# Patient Record
Sex: Male | Born: 1975 | Race: Black or African American | Hispanic: No | Marital: Single | Smoking: Never smoker
Health system: Southern US, Community
[De-identification: ages and names within clinical notes are randomized; demographics above are authoritative.]

---

## 2017-11-06 ENCOUNTER — Encounter (HOSPITAL_BASED_OUTPATIENT_CLINIC_OR_DEPARTMENT_OTHER): Payer: Self-pay | Admitting: Emergency Medicine

## 2017-11-06 ENCOUNTER — Observation Stay (HOSPITAL_BASED_OUTPATIENT_CLINIC_OR_DEPARTMENT_OTHER)
Admission: EM | Admit: 2017-11-06 | Discharge: 2017-11-07 | Disposition: A | Discharge status: Home / Self Care | Payer: BLUE CROSS/BLUE SHIELD | Attending: Surgery | Admitting: Surgery

## 2017-11-06 ENCOUNTER — Encounter (HOSPITAL_COMMUNITY)
Admission: EM | Disposition: A | Discharge status: Home / Self Care | Payer: Self-pay | Source: Home / Self Care | Attending: Emergency Medicine

## 2017-11-06 ENCOUNTER — Emergency Department (HOSPITAL_COMMUNITY): Payer: BLUE CROSS/BLUE SHIELD | Admitting: Certified Registered Nurse Anesthetist

## 2017-11-06 ENCOUNTER — Other Ambulatory Visit: Payer: Self-pay

## 2017-11-06 ENCOUNTER — Emergency Department (HOSPITAL_BASED_OUTPATIENT_CLINIC_OR_DEPARTMENT_OTHER): Payer: BLUE CROSS/BLUE SHIELD

## 2017-11-06 DIAGNOSIS — K3580 Unspecified acute appendicitis: Secondary | ICD-10-CM | POA: Diagnosis present

## 2017-11-06 DIAGNOSIS — K37 Unspecified appendicitis: Secondary | ICD-10-CM | POA: Diagnosis present

## 2017-11-06 DIAGNOSIS — K3589 Other acute appendicitis without perforation or gangrene: Secondary | ICD-10-CM | POA: Diagnosis not present

## 2017-11-06 HISTORY — PX: LAPAROSCOPIC APPENDECTOMY: SHX408

## 2017-11-06 LAB — COMPREHENSIVE METABOLIC PANEL
ALK PHOS: 64 U/L (ref 38–126)
ALT: 34 U/L (ref 0–44)
AST: 29 U/L (ref 15–41)
Albumin: 4.6 g/dL (ref 3.5–5.0)
Anion gap: 11 (ref 5–15)
BILIRUBIN TOTAL: 0.6 mg/dL (ref 0.3–1.2)
BUN: 15 mg/dL (ref 6–20)
CALCIUM: 9.3 mg/dL (ref 8.9–10.3)
CHLORIDE: 94 mmol/L — AB (ref 98–111)
CO2: 27 mmol/L (ref 22–32)
CREATININE: 1.12 mg/dL (ref 0.61–1.24)
GFR calc non Af Amer: >60 mL/min (ref >60)
Glucose, Bld: 131 mg/dL — ABNORMAL HIGH (ref 70–99)
Potassium: 3.6 mmol/L (ref 3.5–5.1)
Sodium: 132 mmol/L — ABNORMAL LOW (ref 135–145)
Total Protein: 7.8 g/dL (ref 6.5–8.1)

## 2017-11-06 LAB — CBC
HCT: 42 % (ref 39.0–52.0)
Hemoglobin: 15.2 g/dL (ref 13.0–17.0)
MCH: 28.8 pg (ref 26.0–34.0)
MCHC: 36.2 g/dL — ABNORMAL HIGH (ref 30.0–36.0)
MCV: 79.5 fL (ref 78.0–100.0)
PLATELETS: 248 10*3/uL (ref 150–400)
RBC: 5.28 MIL/uL (ref 4.22–5.81)
RDW: 13.6 % (ref 11.5–15.5)
WBC: 20 10*3/uL — AB (ref 4.0–10.5)

## 2017-11-06 LAB — URINALYSIS, ROUTINE W REFLEX MICROSCOPIC
Bilirubin Urine: NEGATIVE
GLUCOSE, UA: NEGATIVE mg/dL
KETONES UR: 15 mg/dL — AB
LEUKOCYTES UA: NEGATIVE
Nitrite: NEGATIVE
PROTEIN: 100 mg/dL — AB
Specific Gravity, Urine: 1.025 (ref 1.005–1.030)
pH: 6.5 (ref 5.0–8.0)

## 2017-11-06 LAB — URINALYSIS, MICROSCOPIC (REFLEX)

## 2017-11-06 LAB — LIPASE, BLOOD: Lipase: 36 U/L (ref 11–51)

## 2017-11-06 SURGERY — APPENDECTOMY, LAPAROSCOPIC
Anesthesia: General

## 2017-11-06 MED ORDER — PHENYLEPHRINE 40 MCG/ML (10ML) SYRINGE FOR IV PUSH (FOR BLOOD PRESSURE SUPPORT)
PREFILLED_SYRINGE | INTRAVENOUS | Status: AC
  Filled 2017-11-06: qty 10

## 2017-11-06 MED ORDER — OXYCODONE HCL 5 MG PO TABS: 5.0000 mg | ORAL_TABLET | Freq: Once | ORAL | Status: DC | PRN

## 2017-11-06 MED ORDER — KETOROLAC TROMETHAMINE 30 MG/ML IJ SOLN
INTRAMUSCULAR | Status: AC
  Filled 2017-11-06: qty 1

## 2017-11-06 MED ORDER — OXYCODONE HCL 5 MG PO TABS
5.0000 mg | ORAL_TABLET | ORAL | Status: DC | PRN
  Administered 2017-11-07: 10 mg via ORAL
  Filled 2017-11-06: qty 2

## 2017-11-06 MED ORDER — ROCURONIUM BROMIDE 10 MG/ML (PF) SYRINGE
PREFILLED_SYRINGE | INTRAVENOUS | Status: DC | PRN
  Administered 2017-11-06: 40 mg via INTRAVENOUS

## 2017-11-06 MED ORDER — OXYCODONE HCL 5 MG/5ML PO SOLN
5.0000 mg | Freq: Once | ORAL | Status: DC | PRN
  Filled 2017-11-06: qty 5

## 2017-11-06 MED ORDER — SODIUM CHLORIDE 0.9 % IV SOLN: Freq: Once | INTRAVENOUS | Status: DC

## 2017-11-06 MED ORDER — CEFAZOLIN SODIUM-DEXTROSE 2-3 GM-%(50ML) IV SOLR
INTRAVENOUS | Status: DC | PRN
  Administered 2017-11-06: 2 g via INTRAVENOUS

## 2017-11-06 MED ORDER — FENTANYL CITRATE (PF) 100 MCG/2ML IJ SOLN
INTRAMUSCULAR | Status: DC | PRN
  Administered 2017-11-06: 50 ug via INTRAVENOUS
  Administered 2017-11-06: 100 ug via INTRAVENOUS
  Administered 2017-11-06: 50 ug via INTRAVENOUS

## 2017-11-06 MED ORDER — SUCCINYLCHOLINE CHLORIDE 200 MG/10ML IV SOSY
PREFILLED_SYRINGE | INTRAVENOUS | Status: AC
  Filled 2017-11-06: qty 10

## 2017-11-06 MED ORDER — ONDANSETRON HCL 4 MG/2ML IJ SOLN
INTRAMUSCULAR | Status: AC
  Filled 2017-11-06: qty 2

## 2017-11-06 MED ORDER — ENOXAPARIN SODIUM 40 MG/0.4ML ~~LOC~~ SOLN: 40.0000 mg | SUBCUTANEOUS | Status: DC

## 2017-11-06 MED ORDER — PIPERACILLIN-TAZOBACTAM 3.375 G IVPB 30 MIN
3.3750 g | Freq: Once | INTRAVENOUS | Status: AC
  Administered 2017-11-06: 3.375 g via INTRAVENOUS
  Filled 2017-11-06 (×2): qty 50

## 2017-11-06 MED ORDER — ONDANSETRON HCL 4 MG/2ML IJ SOLN
4.0000 mg | Freq: Once | INTRAMUSCULAR | Status: AC
  Administered 2017-11-06: 4 mg via INTRAVENOUS
  Filled 2017-11-06: qty 2

## 2017-11-06 MED ORDER — LACTATED RINGERS IV SOLN
INTRAVENOUS | Status: AC | PRN
  Administered 2017-11-06: 1000 mL via INTRAVENOUS

## 2017-11-06 MED ORDER — FENTANYL CITRATE (PF) 100 MCG/2ML IJ SOLN: 25.0000 ug | INTRAMUSCULAR | Status: DC | PRN

## 2017-11-06 MED ORDER — PIPERACILLIN-TAZOBACTAM 3.375 G IVPB
3.3750 g | Freq: Three times a day (TID) | INTRAVENOUS | Status: AC
  Administered 2017-11-06: 3.375 g via INTRAVENOUS
  Filled 2017-11-06: qty 50

## 2017-11-06 MED ORDER — LACTATED RINGERS IV SOLN
INTRAVENOUS | Status: DC | PRN
Start: 2017-11-06 — End: 2017-11-06
  Administered 2017-11-06 (×2): via INTRAVENOUS

## 2017-11-06 MED ORDER — BUPIVACAINE HCL (PF) 0.5 % IJ SOLN
INTRAMUSCULAR | Status: AC
  Filled 2017-11-06: qty 30

## 2017-11-06 MED ORDER — MIDAZOLAM HCL 2 MG/2ML IJ SOLN
INTRAMUSCULAR | Status: AC
  Filled 2017-11-06: qty 2

## 2017-11-06 MED ORDER — SUGAMMADEX SODIUM 200 MG/2ML IV SOLN
INTRAVENOUS | Status: DC | PRN
  Administered 2017-11-06: 200 mg via INTRAVENOUS

## 2017-11-06 MED ORDER — IOPAMIDOL (ISOVUE-300) INJECTION 61%
100.0000 mL | Freq: Once | INTRAVENOUS | Status: AC | PRN
  Administered 2017-11-06: 100 mL via INTRAVENOUS

## 2017-11-06 MED ORDER — ONDANSETRON HCL 4 MG/2ML IJ SOLN: 4.0000 mg | Freq: Once | INTRAMUSCULAR | Status: DC | PRN

## 2017-11-06 MED ORDER — SUCCINYLCHOLINE CHLORIDE 20 MG/ML IJ SOLN
INTRAMUSCULAR | Status: DC | PRN
  Administered 2017-11-06: 120 mg via INTRAVENOUS

## 2017-11-06 MED ORDER — KETOROLAC TROMETHAMINE 30 MG/ML IJ SOLN
INTRAMUSCULAR | Status: DC | PRN
  Administered 2017-11-06: 30 mg via INTRAVENOUS

## 2017-11-06 MED ORDER — IOPAMIDOL (ISOVUE-300) INJECTION 61%
30.0000 mL | Freq: Once | INTRAVENOUS | Status: AC | PRN
  Administered 2017-11-06: 15 mL via ORAL

## 2017-11-06 MED ORDER — HYDROMORPHONE HCL 1 MG/ML IJ SOLN
1.0000 mg | Freq: Once | INTRAMUSCULAR | Status: AC
  Administered 2017-11-06: 1 mg via INTRAVENOUS
  Filled 2017-11-06: qty 1

## 2017-11-06 MED ORDER — ONDANSETRON HCL 4 MG/2ML IJ SOLN
INTRAMUSCULAR | Status: DC | PRN
  Administered 2017-11-06: 4 mg via INTRAVENOUS

## 2017-11-06 MED ORDER — CEFAZOLIN SODIUM-DEXTROSE 2-4 GM/100ML-% IV SOLN
INTRAVENOUS | Status: AC
  Filled 2017-11-06: qty 100

## 2017-11-06 MED ORDER — ACETAMINOPHEN 500 MG PO TABS
1000.0000 mg | ORAL_TABLET | Freq: Four times a day (QID) | ORAL | Status: DC
  Administered 2017-11-06 – 2017-11-07 (×3): 1000 mg via ORAL
  Filled 2017-11-06 (×3): qty 2

## 2017-11-06 MED ORDER — PHENYLEPHRINE 40 MCG/ML (10ML) SYRINGE FOR IV PUSH (FOR BLOOD PRESSURE SUPPORT)
PREFILLED_SYRINGE | INTRAVENOUS | Status: DC | PRN
  Administered 2017-11-06: 80 ug via INTRAVENOUS
  Administered 2017-11-06: 120 ug via INTRAVENOUS

## 2017-11-06 MED ORDER — SODIUM CHLORIDE 0.9 % IV BOLUS
1000.0000 mL | Freq: Once | INTRAVENOUS | Status: AC
  Administered 2017-11-06: 1000 mL via INTRAVENOUS

## 2017-11-06 MED ORDER — MIDAZOLAM HCL 5 MG/5ML IJ SOLN
INTRAMUSCULAR | Status: DC | PRN
  Administered 2017-11-06: 2 mg via INTRAVENOUS

## 2017-11-06 MED ORDER — DIPHENHYDRAMINE HCL 50 MG/ML IJ SOLN: 25.0000 mg | Freq: Four times a day (QID) | INTRAMUSCULAR | Status: DC | PRN

## 2017-11-06 MED ORDER — ACETAMINOPHEN 160 MG/5ML PO SOLN: 325.0000 mg | ORAL | Status: DC | PRN

## 2017-11-06 MED ORDER — HYDROMORPHONE HCL 1 MG/ML IJ SOLN: 1.0000 mg | INTRAMUSCULAR | Status: DC | PRN

## 2017-11-06 MED ORDER — DEXAMETHASONE SODIUM PHOSPHATE 10 MG/ML IJ SOLN
INTRAMUSCULAR | Status: DC | PRN
  Administered 2017-11-06: 5 mg via INTRAVENOUS

## 2017-11-06 MED ORDER — ACETAMINOPHEN 325 MG PO TABS: 325.0000 mg | ORAL_TABLET | ORAL | Status: DC | PRN

## 2017-11-06 MED ORDER — ONDANSETRON 4 MG PO TBDP: 4.0000 mg | ORAL_TABLET | Freq: Four times a day (QID) | ORAL | Status: DC | PRN

## 2017-11-06 MED ORDER — FENTANYL CITRATE (PF) 250 MCG/5ML IJ SOLN
INTRAMUSCULAR | Status: AC
  Filled 2017-11-06: qty 5

## 2017-11-06 MED ORDER — POTASSIUM CHLORIDE IN NACL 20-0.9 MEQ/L-% IV SOLN
INTRAVENOUS | Status: DC
  Administered 2017-11-06: 23:00:00 via INTRAVENOUS
  Filled 2017-11-06 (×2): qty 1000

## 2017-11-06 MED ORDER — BUPIVACAINE HCL (PF) 0.5 % IJ SOLN
INTRAMUSCULAR | Status: DC | PRN
  Administered 2017-11-06: 20 mL

## 2017-11-06 MED ORDER — LIDOCAINE 2% (20 MG/ML) 5 ML SYRINGE
INTRAMUSCULAR | Status: DC | PRN
  Administered 2017-11-06: 80 mg via INTRAVENOUS

## 2017-11-06 MED ORDER — PROPOFOL 10 MG/ML IV BOLUS
INTRAVENOUS | Status: DC | PRN
  Administered 2017-11-06: 200 mg via INTRAVENOUS

## 2017-11-06 MED ORDER — MORPHINE SULFATE (PF) 2 MG/ML IV SOLN
1.0000 mg | INTRAVENOUS | Status: DC | PRN
  Administered 2017-11-07 (×2): 4 mg via INTRAVENOUS
  Filled 2017-11-06 (×2): qty 2

## 2017-11-06 MED ORDER — SUGAMMADEX SODIUM 200 MG/2ML IV SOLN
INTRAVENOUS | Status: AC
  Filled 2017-11-06: qty 4

## 2017-11-06 MED ORDER — PROPOFOL 10 MG/ML IV BOLUS
INTRAVENOUS | Status: AC
  Filled 2017-11-06: qty 20

## 2017-11-06 MED ORDER — EPHEDRINE 5 MG/ML INJ
INTRAVENOUS | Status: AC
  Filled 2017-11-06: qty 10

## 2017-11-06 MED ORDER — ONDANSETRON HCL 4 MG/2ML IJ SOLN: 4.0000 mg | Freq: Four times a day (QID) | INTRAMUSCULAR | Status: DC | PRN

## 2017-11-06 MED ORDER — DEXAMETHASONE SODIUM PHOSPHATE 10 MG/ML IJ SOLN
INTRAMUSCULAR | Status: AC
  Filled 2017-11-06: qty 1

## 2017-11-06 MED ORDER — MEPERIDINE HCL 50 MG/ML IJ SOLN: 6.2500 mg | INTRAMUSCULAR | Status: DC | PRN

## 2017-11-06 MED ORDER — DIPHENHYDRAMINE HCL 25 MG PO CAPS: 25.0000 mg | ORAL_CAPSULE | Freq: Four times a day (QID) | ORAL | Status: DC | PRN

## 2017-11-06 SURGICAL SUPPLY — 29 items
APPLIER CLIP 5 13 M/L LIGAMAX5 (MISCELLANEOUS)
CHLORAPREP W/TINT 26ML (MISCELLANEOUS) ×3 IMPLANT
CLIP APPLIE 5 13 M/L LIGAMAX5 (MISCELLANEOUS) IMPLANT
COVER SURGICAL LIGHT HANDLE (MISCELLANEOUS) ×3 IMPLANT
CUTTER FLEX LINEAR 45M (STAPLE) IMPLANT
DECANTER SPIKE VIAL GLASS SM (MISCELLANEOUS) ×3 IMPLANT
DERMABOND ADVANCED (GAUZE/BANDAGES/DRESSINGS) ×2
DERMABOND ADVANCED .7 DNX12 (GAUZE/BANDAGES/DRESSINGS) ×1 IMPLANT
DRAPE LAPAROSCOPIC ABDOMINAL (DRAPES) ×3 IMPLANT
ELECT COAG MONOPOLAR (ELECTROSURGICAL) ×3
ELECT REM PT RETURN 15FT ADLT (MISCELLANEOUS) ×3 IMPLANT
ELECTRODE COAG MONOPOLAR (ELECTROSURGICAL) ×1 IMPLANT
GLOVE SURG SIGNA 7.5 PF LTX (GLOVE) ×6 IMPLANT
GOWN STRL REUS W/TWL XL LVL3 (GOWN DISPOSABLE) ×6 IMPLANT
KIT BASIN OR (CUSTOM PROCEDURE TRAY) ×3 IMPLANT
POUCH SPECIMEN RETRIEVAL 10MM (ENDOMECHANICALS) ×3 IMPLANT
RELOAD 45 VASCULAR/THIN (ENDOMECHANICALS) ×3 IMPLANT
RELOAD STAPLE TA45 3.5 REG BLU (ENDOMECHANICALS) ×3 IMPLANT
SET IRRIG TUBING LAPAROSCOPIC (IRRIGATION / IRRIGATOR) ×3 IMPLANT
SHEARS HARMONIC ACE PLUS 36CM (ENDOMECHANICALS) ×3 IMPLANT
SLEEVE XCEL OPT CAN 5 100 (ENDOMECHANICALS) ×3 IMPLANT
SUT MNCRL AB 4-0 PS2 18 (SUTURE) ×3 IMPLANT
TOWEL OR 17X26 10 PK STRL BLUE (TOWEL DISPOSABLE) ×3 IMPLANT
TOWEL OR NON WOVEN STRL DISP B (DISPOSABLE) ×3 IMPLANT
TRAY FOLEY MTR SLVR 16FR STAT (SET/KITS/TRAYS/PACK) ×3 IMPLANT
TRAY LAPAROSCOPIC (CUSTOM PROCEDURE TRAY) ×3 IMPLANT
TROCAR BLADELESS OPT 5 100 (ENDOMECHANICALS) ×3 IMPLANT
TROCAR XCEL BLUNT TIP 100MML (ENDOMECHANICALS) ×3 IMPLANT
TUBING INSUF HEATED (TUBING) ×3 IMPLANT

## 2017-11-06 NOTE — Progress Notes (Signed)
Patient admitted from [facility] at 2230, s/p lap app.  AOX3 minimal surgical discomfort. B/p 157/107 Dr Magnus Ivanblackman aware, no new orders noted

## 2017-11-06 NOTE — Anesthesia Procedure Notes (Signed)
Procedure Name: Intubation Performed by: Zinia Innocent J, CRNA Pre-anesthesia Checklist: Patient identified, Emergency Drugs available, Suction available, Patient being monitored and Timeout performed Patient Re-evaluated:Patient Re-evaluated prior to induction Oxygen Delivery Method: Circle system utilized Preoxygenation: Pre-oxygenation with 100% oxygen Induction Type: IV induction and Rapid sequence Laryngoscope Size: Mac and 4 Grade View: Grade I Tube type: Oral Tube size: 7.5 mm Number of attempts: 1 Airway Equipment and Method: Stylet Placement Confirmation: ETT inserted through vocal cords under direct vision,  positive ETCO2,  CO2 detector and breath sounds checked- equal and bilateral Secured at: 23 cm Tube secured with: Tape Dental Injury: Teeth and Oropharynx as per pre-operative assessment        

## 2017-11-06 NOTE — ED Provider Notes (Signed)
[facility] EMERGENCY DEPARTMENT Provider Note   CSN: [id number] Arrival date & time: 11/06/17  1301     History   Chief Complaint Chief Complaint  Patient presents with  . Abdominal Pain    HPI Jeremy ShieldsRicky Savini is a 42 y.o. male.  HPI  Jeremy ShieldsRicky Lewis is a 42 year old male with no significant past medical history who presents emergency department for evaluation of right lower quadrant pain.  Patient reports that his pain began gradually today at 12 AM.  He was working and had to go home due to the pain.  Pain feels "like someone is punching me" and is constant.  It is currently an 8/10 in severity at this time.  His pain occasionally radiates to the right flank.  He tried taking some Gas-X earlier today which helped somewhat.  He has also had nausea and vomiting, reports 4 episodes of nonbloody emesis today.  He denies fevers, chills, penile discharge, dysuria, urinary frequency, hematuria, trouble voiding, testicular pain/swelling, diarrhea, chest pain, shortness of breath, lightheadedness or syncope.  He denies prior abdominal surgeries.  Last bowel movement was today and normal for him.  History reviewed. No pertinent past medical history.  There are no active problems to display for this patient.   History reviewed. No pertinent surgical history.      Home Medications    Prior to Admission medications   Not on File    Family History No family history on file.  Social History Social History   Tobacco Use  . Smoking status: Never Smoker  . Smokeless tobacco: Never Used  Substance Use Topics  . Alcohol use: Not Currently  . Drug use: Never     Allergies   Patient has no allergy information on record.   Review of Systems Review of Systems  Constitutional: Negative for chills and fever.  Eyes: Negative for visual disturbance.  Respiratory: Negative for shortness of breath.   Cardiovascular: Negative for chest pain.  Gastrointestinal: Positive for  abdominal pain (rlq), nausea and vomiting. Negative for blood in stool and diarrhea.  Genitourinary: Negative for difficulty urinating, dysuria, frequency, hematuria, scrotal swelling and testicular pain.  Musculoskeletal: Negative for back pain.  Skin: Negative for rash.  Neurological: Negative for syncope and light-headedness.  Psychiatric/Behavioral: Negative for agitation.     Physical Exam Updated Vital Signs BP (!) 177/110 (BP Location: Left Arm)   Pulse (!) 107   Temp 98.7 F (37.1 C) (Oral)   Resp 18   Ht 5\' 10"  (1.778 m)   Wt 88.5 kg (195 lb)   SpO2 98%   BMI 27.98 kg/m   Physical Exam  Constitutional: He is oriented to person, place, and time. He appears well-developed and well-nourished. No distress.  Non-toxic appearing.   HENT:  Head: Normocephalic and atraumatic.  Mouth/Throat: Oropharynx is clear and moist. No oropharyngeal exudate.  Mucous membranes moist.  Eyes: Pupils are equal, round, and reactive to light. Conjunctivae are normal. Right eye exhibits no discharge. Left eye exhibits no discharge.  Neck: Normal range of motion. Neck supple.  Cardiovascular: Normal rate, regular rhythm and intact distal pulses.  No murmur heard. Pulmonary/Chest: Effort normal and breath sounds normal. No stridor. No respiratory distress. He has no wheezes. He has no rales.  Abdominal: Soft.  Abdomen soft and non-distended. Tender to palpation right of the umbilicus as well as right flank. No guarding, rigidity or rebound tenderness. Negative McBurney's point. Negative Murphy's sign. No CVA tenderness.   Musculoskeletal: Normal range of  motion.  Neurological: He is alert and oriented to person, place, and time. Coordination normal.  Skin: Skin is warm and dry. Capillary refill takes less than 2 seconds. He is not diaphoretic.  Psychiatric: He has a normal mood and affect. His behavior is normal.  Nursing note and vitals reviewed.    ED Treatments / Results  Labs (all labs  ordered are listed, but only abnormal results are displayed) Labs Reviewed  COMPREHENSIVE METABOLIC PANEL - Abnormal; Notable for the following components:      Result Value   Sodium 132 (*)    Chloride 94 (*)    Glucose, Bld 131 (*)    All other components within normal limits  CBC - Abnormal; Notable for the following components:   WBC 20.0 (*)    MCHC 36.2 (*)    All other components within normal limits  URINALYSIS, ROUTINE W REFLEX MICROSCOPIC - Abnormal; Notable for the following components:   Hgb urine dipstick LARGE (*)    Ketones, ur 15 (*)    Protein, ur 100 (*)    All other components within normal limits  URINALYSIS, MICROSCOPIC (REFLEX) - Abnormal; Notable for the following components:   Bacteria, UA FEW (*)    All other components within normal limits  LIPASE, BLOOD  BASIC METABOLIC PANEL  CBC  SURGICAL PATHOLOGY    EKG None  Radiology Ct Abdomen Pelvis W Contrast  Result Date: 11/06/2017 CLINICAL DATA:  RIGHT lower quadrant pain since midnight. EXAM: CT ABDOMEN AND PELVIS WITH CONTRAST TECHNIQUE: Multidetector CT imaging of the abdomen and pelvis was performed using the standard protocol following bolus administration of intravenous contrast. CONTRAST:  15mL ISOVUE-300 IOPAMIDOL (ISOVUE-300) INJECTION 61%, ISOVUE-300 IOPAMIDOL (ISOVUE-300) INJECTION 61% COMPARISON:  None. FINDINGS: Lower chest: No acute abnormality. Hepatobiliary: No focal liver abnormality is seen. No gallstones, gallbladder wall thickening, or biliary dilatation. Pancreas: Unremarkable. No pancreatic ductal dilatation or surrounding inflammatory changes. Spleen: Normal in size without focal abnormality. Adrenals/Urinary Tract: Adrenal glands are unremarkable. Kidneys are normal, without renal calculi, focal lesion, or hydronephrosis. Bladder is unremarkable. Simple cysts in the RIGHT greater than LEFT kidney, largest of which is RIGHT parapelvic, greater than 3 cm. Stomach/Bowel: Stomach, and  small bowel are normal. There is acute appendicitis, dilated appendix with appendicolith up to 14 mm in diameter with surrounding inflammation, up to 14 mm in diameter, with surrounding inflammatory change, query localized perforation. The cecum is intrahepatic, and the inflammatory change extends along the RIGHT pericolic gutter. Vascular/Lymphatic: No significant vascular findings are present. No enlarged abdominal or pelvic lymph nodes. Reproductive: Prostate is unremarkable. Other: Small fat containing umbilical hernia. Musculoskeletal: Lumbar spondylosis. IMPRESSION: Acute appendicitis, with possible localized perforation. These results were called by telephone at the time of interpretation on 11/06/2017 at 4:04 pm to [facility] , who verbally acknowledged these results. Electronically Signed   By: Elsie Stain M.D.   On: 11/06/2017 16:06    Procedures Procedures (including critical care time)  Medications Ordered in ED Medications  HYDROmorphone (DILAUDID) injection 1 mg (has no administration in time range)  ondansetron (ZOFRAN) injection 4 mg (has no administration in time range)  sodium chloride 0.9 % bolus 1,000 mL (has no administration in time range)     Initial Impression / Assessment and Plan / ED Course  I have reviewed the triage vital signs and the nursing notes.  Pertinent labs & imaging results that were available during my care of the patient were reviewed by me and considered  in my medical decision making (see chart for details).     CT abdomen/pelvis with acute appendicitis with possible local perforation.  Patient started on IV Zosyn.  Discussed this patient with Dr. Magnus Ivan who plans to operate later today.  Patient will be transferred to [facility].  Discussed this patient with Dr. Erma Heritage who will accept patient for ED to ED transfer.  Patient informed about plan and he agrees.  Final Clinical Impressions(s) / ED Diagnoses   Final diagnoses:  Acute  appendicitis, unspecified acute appendicitis type    ED Discharge Orders    None       Lawrence Marseilles 11/07/17 0040    Tilden Fossa, MD 11/07/17 [phone]

## 2017-11-06 NOTE — Anesthesia Preprocedure Evaluation (Signed)
Anesthesia Evaluation  Patient identified by MRN, date of birth, ID band Patient awake    Reviewed: Allergy & Precautions, H&P , NPO status , Patient's Chart, lab work & pertinent test results, reviewed documented beta blocker date and time   Airway Mallampati: II  TM Distance: >3 FB Neck ROM: full    Dental no notable dental hx. (+) Teeth Intact   Pulmonary    Pulmonary exam normal breath sounds clear to auscultation       Cardiovascular Exercise Tolerance: Good Normal cardiovascular exam Rhythm:regular Rate:Normal     Neuro/Psych negative psych ROS   GI/Hepatic   Endo/Other    Renal/GU      Musculoskeletal   Abdominal   Peds  Hematology   Anesthesia Other Findings   Reproductive/Obstetrics                             Anesthesia Physical Anesthesia Plan  ASA: II and emergent  Anesthesia Plan: General   Post-op Pain Management:    Induction: Cricoid pressure planned  PONV Risk Score and Plan: 2 and Ondansetron, Dexamethasone and Treatment may vary due to age or medical condition  Airway Management Planned: Oral ETT  Additional Equipment:   Intra-op Plan:   Post-operative Plan:   Informed Consent: I have reviewed the patients History and Physical, chart, labs and discussed the procedure including the risks, benefits and alternatives for the proposed anesthesia with the patient or authorized representative who has indicated his/her understanding and acceptance.   Dental Advisory Given  Plan Discussed with: CRNA, Surgeon and Anesthesiologist  Anesthesia Plan Comments:         Anesthesia Quick Evaluation

## 2017-11-06 NOTE — ED Notes (Signed)
EDP aware of pt's BP.  No new orders given at present

## 2017-11-06 NOTE — Op Note (Signed)
Appendectomy, Lap, Procedure Note  Indications: The patient presented with a history of right-sided abdominal pain. A CT revealed findings consistent with acute appendicitis.  Pre-operative Diagnosis: acute appendicitis  Post-operative Diagnosis: Same  Surgeon: Abigail MiyamotoBLACKMAN,Khamron Gellert A   Assistants: 0  Anesthesia: General endotracheal anesthesia  ASA Class: 2  Procedure Details  The patient was seen again in the Holding Room. The risks, benefits, complications, treatment options, and expected outcomes were discussed with the patient and/or family. The possibilities of reaction to medication, perforation of viscus, bleeding, recurrent infection, finding a normal appendix, the need for additional procedures, failure to diagnose a condition, and creating a complication requiring transfusion or operation were discussed. There was concurrence with the proposed plan and informed consent was obtained. The site of surgery was properly noted. The patient was taken to Operating Room, identified as Jeremy Lewis and the procedure verified as Appendectomy. A Time Out was held and the above information confirmed.  The patient was placed in the supine position and general anesthesia was induced, along with placement of orogastric tube, Venodyne boots, and a Foley catheter. The abdomen was prepped and draped in a sterile fashion. A one centimeter infraumbilical incision was made.  The  midline fascia was incised with a #15 blade.  A Kelly clamp was used to confirm entrance into the peritoneal cavity.  A pursestring suture was passed around the incision with a 0 Vicryl.  The Hasson was introduced into the abdomen and the tails of the suture were used to hold the Hasson in place.   The pneumoperitoneum was then established to steady pressure of 15 mmHg.  Additional 5 mm cannulas then placed in the left lower quadrant of the abdomen and right upper quadrant under direct visualization. A careful evaluation of the entire  abdomen was carried out. The patient was placed in Trendelenburg and left lateral decubitus position. The small intestines were retracted in the cephalad and left lateral direction away from the pelvis and right lower quadrant. The patient was found to have an enlarged and inflamed appendix that was extending into the pelvis. There was no evidence of perforation.  The appendix was carefully dissected. The appendix was was skeletonized with the harmonic scalpel.   The appendix was divided at its base using an endo-GIA stapler. Minimal appendiceal stump was left in place. There was no evidence of bleeding, leakage, or complication after division of the appendix. Irrigation was also performed and irrigate suctioned from the abdomen as well.  The umbilical port site was closed with the purse string suture. There was no residual palpable fascial defect.  The trocar site skin wounds were closed with 4-0 Monocryl.  Instrument, sponge, and needle counts were correct at the conclusion of the case.   Findings: The appendix was found to be inflamed. There were not signs of necrosis.  There was not perforation. There was not abscess formation.  Estimated Blood Loss:  Minimal         Drains:none         Complications:  None; patient tolerated the procedure well.         Disposition: PACU - hemodynamically stable.         Condition: stable

## 2017-11-06 NOTE — H&P (Signed)
Jeremy Lewis is an 42 y.o. male.   Chief Complaint: RLQ abdominal pain HPI: This is a 42 year old gentleman who presents with right lower quadrant abdominal pain.  He started developing burning abdominal pain around midnight.  He was currently working and had to leave work.  He then developed nausea and vomiting.  He ended up going to [place].  He had a CT scan showing acute appendicitis so he was then transferred to [facility].  He is otherwise healthy without complaints.  Bowel movements have been normal.  The pain is currently described as sharp and moderate to severe in the right lower quadrant.  History reviewed. No pertinent past medical history.  History reviewed. No pertinent surgical history.  No family history on file. Social History:  reports that he has never smoked. He has never used smokeless tobacco. He reports that he drank alcohol. He reports that he does not use drugs.  Allergies: No Known Allergies   (Not in a hospital admission)  Results for orders placed or performed during the hospital encounter of 11/06/17 (from the past 48 hour(s))  Urinalysis, Routine w reflex microscopic     Status: Abnormal   Collection Time: 11/06/17  1:13 PM  Result Value Ref Range   Color, Urine YELLOW YELLOW   APPearance CLEAR CLEAR   Specific Gravity, Urine 1.025 1.005 - 1.030   pH 6.5 5.0 - 8.0   Glucose, UA NEGATIVE NEGATIVE mg/dL   Hgb urine dipstick LARGE (A) NEGATIVE   Bilirubin Urine NEGATIVE NEGATIVE   Ketones, ur 15 (A) NEGATIVE mg/dL   Protein, ur 100 (A) NEGATIVE mg/dL   Nitrite NEGATIVE NEGATIVE   Leukocytes, UA NEGATIVE NEGATIVE    Comment: Performed at [facility], [address] [place], [place] [address]  Urinalysis, Microscopic (reflex)     Status: Abnormal   Collection Time: 11/06/17  1:13 PM  Result Value Ref Range   RBC / HPF 21-50 0 - 5 RBC/hpf   WBC, UA 0-5 0 - 5 WBC/hpf   Bacteria, UA FEW (A) NONE SEEN   Squamous  Epithelial / LPF 0-5 0 - 5   Mucus PRESENT     Comment: Performed at [facility], [address] [place], [place] [address]  Lipase, blood     Status: None   Collection Time: 11/06/17  2:00 PM  Result Value Ref Range   Lipase 36 11 - 51 U/L    Comment: Performed at [facility], [address] [place], [place] [address]  Comprehensive metabolic panel     Status: Abnormal   Collection Time: 11/06/17  2:00 PM  Result Value Ref Range   Sodium 132 (L) 135 - 145 mmol/L   Potassium 3.6 3.5 - 5.1 mmol/L   Chloride 94 (L) 98 - 111 mmol/L    Comment: Please note change in reference range.   CO2 27 22 - 32 mmol/L   Glucose, Bld 131 (H) 70 - 99 mg/dL    Comment: Please note change in reference range.   BUN 15 6 - 20 mg/dL    Comment: Please note change in reference range.   Creatinine, Ser 1.12 0.61 - 1.24 mg/dL   Calcium 9.3 8.9 - 10.3 mg/dL   Total Protein 7.8 6.5 - 8.1 g/dL   Albumin 4.6 3.5 - 5.0 g/dL   AST 29 15 - 41 U/L   ALT 34 0 - 44 U/L  Comment: Please note change in reference range.   Alkaline Phosphatase 64 38 - 126 U/L   Total Bilirubin 0.6 0.3 - 1.2 mg/dL   GFR calc non Af Amer >60 >60 mL/min   GFR calc Af Amer >60 >60 mL/min    Comment: (NOTE) The eGFR has been calculated using the CKD EPI equation. This calculation has not been validated in all clinical situations. eGFR's persistently <60 mL/min signify possible Chronic Kidney Disease.    Anion gap 11 5 - 15    Comment: Performed at [facility], [address] [place], [place] [address]  CBC     Status: Abnormal   Collection Time: 11/06/17  2:00 PM  Result Value Ref Range   WBC 20.0 (H) 4.0 - 10.5 K/uL   RBC 5.28 4.22 - 5.81 MIL/uL   Hemoglobin 15.2 13.0 - 17.0 g/dL   HCT 42.0 39.0 - 52.0 %   MCV 79.5 78.0 - 100.0 fL   MCH 28.8 26.0 - 34.0 pg   MCHC 36.2 (H) 30.0 - 36.0 g/dL   RDW 13.6 11.5 - 15.5 %   Platelets 248 150 - 400 K/uL    Comment: Performed at [facility], [address] [place], [place] [address]   Ct Abdomen Pelvis W Contrast  Result Date: 11/06/2017 CLINICAL DATA:  RIGHT lower quadrant pain since midnight. EXAM: CT ABDOMEN AND PELVIS WITH CONTRAST TECHNIQUE: Multidetector CT imaging of the abdomen and pelvis was performed using the standard protocol following bolus administration of intravenous contrast. CONTRAST:  68m ISOVUE-300 IOPAMIDOL (ISOVUE-300) INJECTION 61%, 1096mISOVUE-300 IOPAMIDOL (ISOVUE-300) INJECTION 61% COMPARISON:  None. FINDINGS: Lower chest: No acute abnormality. Hepatobiliary: No focal liver abnormality is seen. No gallstones, gallbladder wall thickening, or biliary dilatation. Pancreas: Unremarkable. No pancreatic ductal dilatation or surrounding inflammatory changes. Spleen: Normal in size without focal abnormality. Adrenals/Urinary Tract: Adrenal glands are unremarkable. Kidneys are normal, without renal calculi, focal lesion, or hydronephrosis. Bladder is unremarkable. Simple cysts in the RIGHT greater than LEFT kidney, largest of which is RIGHT parapelvic, greater than 3 cm. Stomach/Bowel: Stomach, and small bowel are normal. There is acute appendicitis, dilated appendix with appendicolith up to 14 mm in diameter with surrounding inflammation, up to 14 mm in diameter, with surrounding inflammatory change, query localized perforation. The cecum is intrahepatic, and the inflammatory change extends along the RIGHT pericolic gutter. Vascular/Lymphatic: No significant vascular findings are present. No enlarged abdominal or pelvic lymph nodes. Reproductive: Prostate is unremarkable. Other: Small fat containing umbilical hernia. Musculoskeletal: Lumbar spondylosis. IMPRESSION: Acute appendicitis, with possible localized perforation. These results were called by telephone at the time of interpretation on 11/06/2017 at 4:04 pm to [facility] who verbally acknowledged these results. Electronically Signed   By: JoStaci Righter.D.   On:  11/06/2017 16:06    Review of Systems  Constitutional: Negative for chills and fever.  Respiratory: Negative for cough and shortness of breath.   Cardiovascular: Negative for chest pain.  Gastrointestinal: Positive for abdominal pain, nausea and vomiting.  Genitourinary: Negative for dysuria.  Musculoskeletal: Negative for myalgias.  All other systems reviewed and are negative.   Blood pressure (!) 175/104, pulse 94, temperature 98.4 F (36.9 C), temperature source Oral, resp. rate 18, height _0  (1.778 m), weight 88.5 kg (195 lb), SpO2 97 %. Physical Exam  Constitutional: He is oriented to person, place, and time. He appears well-developed and well-nourished. No distress.  HENT:  Head: Normocephalic and atraumatic.  Right Ear: External ear normal.  Left Ear: External ear normal.  Nose: Nose normal.  Mouth/Throat: Oropharynx is clear and moist. No oropharyngeal exudate.  Eyes: Pupils are equal, round, and reactive to light. Right eye exhibits no discharge. Left eye exhibits no discharge. No scleral icterus.  Neck: Normal range of motion. No tracheal deviation present.  Cardiovascular: Normal rate, regular rhythm, normal heart sounds and intact distal pulses.  No murmur heard. Respiratory: Effort normal and breath sounds normal. No respiratory distress. He has no wheezes.  GI: Soft. There is tenderness. There is guarding.  There is tenderness with guarding in the right lower quadrant  Musculoskeletal: Normal range of motion. He exhibits no edema.  Neurological: He is alert and oriented to person, place, and time.  Skin: Skin is warm. He is not diaphoretic. No erythema.  Psychiatric: His behavior is normal. Judgment normal.     Assessment/Plan Acute appendicitis  I discussed the diagnosis with the patient and his family.  The CT scan shows acute appendicitis within appendicolith and possible signs of perforation.  An appendectomy is recommended.  I discussed the procedure with  him in detail.  I discussed the laparoscopic approach.  We discussed the risk of surgery which includes but is not limited to bleeding, infection, injury to surrounding structures, conversion to an open procedure, the need for other procedures, cardiopulmonary issues, DVT, postoperative recovery, etc.  He understands and wished to proceed with surgery which is scheduled urgently.  Harl Bowie, MD 11/06/2017, 7:40 PM

## 2017-11-06 NOTE — Transfer of Care (Signed)
Immediate Anesthesia Transfer of Care Note  Patient: Jeremy ShieldsRicky Lewis  Procedure(s) Performed: APPENDECTOMY LAPAROSCOPIC (N/A )  Patient Location: PACU  Anesthesia Type:General  Level of Consciousness: awake, alert  and oriented  Airway & Oxygen Therapy: Patient Spontanous Breathing and Patient connected to face mask oxygen  Post-op Assessment: Report given to RN and Post -op Vital signs reviewed and stable  Post vital signs: Reviewed and stable  Last Vitals:  Vitals Value Taken Time  BP 159/98 11/06/2017  9:16 PM  Temp    Pulse 101 11/06/2017  9:17 PM  Resp 16 11/06/2017  9:17 PM  SpO2 100 % 11/06/2017  9:17 PM  Vitals shown include unvalidated device data.  Last Pain:  Vitals:   11/06/17 1923  TempSrc: Oral  PainSc:          Complications: No apparent anesthesia complications

## 2017-11-06 NOTE — ED Triage Notes (Signed)
RLQ abd pain since yesterday with vomiting.

## 2017-11-06 NOTE — ED Provider Notes (Signed)
42 year old male with acute appendicitis transferred from med center.  Patient with leukocytosis but no hypotension no evidence of sepsis.  CT scan reviewed by myself, concerning for acute appendicitis possibly with localized rupture.  Patient was given a dose of Zosyn at med center and is not due for this again.  Will consult general surgery who is aware of the transfer.  Patient n.p.o.   Shaune PollackIsaacs, Sorcha Rotunno, MD 11/06/17 1924

## 2017-11-07 ENCOUNTER — Encounter (HOSPITAL_COMMUNITY): Payer: Self-pay | Admitting: Surgery

## 2017-11-07 LAB — BASIC METABOLIC PANEL
ANION GAP: 8 (ref 5–15)
BUN: 18 mg/dL (ref 6–20)
CALCIUM: 8.3 mg/dL — AB (ref 8.9–10.3)
CO2: 26 mmol/L (ref 22–32)
Chloride: 102 mmol/L (ref 98–111)
Creatinine, Ser: 1.23 mg/dL (ref 0.61–1.24)
GLUCOSE: 108 mg/dL — AB (ref 70–99)
Potassium: 4.2 mmol/L (ref 3.5–5.1)
Sodium: 136 mmol/L (ref 135–145)

## 2017-11-07 LAB — CBC
HEMATOCRIT: 39.1 % (ref 39.0–52.0)
HEMOGLOBIN: 13.3 g/dL (ref 13.0–17.0)
MCH: 27.9 pg (ref 26.0–34.0)
MCHC: 34 g/dL (ref 30.0–36.0)
MCV: 82 fL (ref 78.0–100.0)
Platelets: 238 10*3/uL (ref 150–400)
RBC: 4.77 MIL/uL (ref 4.22–5.81)
RDW: 14.2 % (ref 11.5–15.5)
WBC: 16.4 10*3/uL — ABNORMAL HIGH (ref 4.0–10.5)

## 2017-11-07 MED ORDER — OXYCODONE HCL 5 MG PO TABS: 5.0000 mg | ORAL_TABLET | Freq: Four times a day (QID) | ORAL | 0 refills | Status: AC | PRN

## 2017-11-07 NOTE — Progress Notes (Signed)
Went over AVS with patient.  He verbalized understanding.  Left hospital with all belongings, AVS, note, and hard script. Levora AngelHannah V Vuong Musa, RN

## 2017-11-07 NOTE — Discharge Summary (Signed)
[facility] Discharge Summary   Patient ID: Jeremy Lewis MRN: [id number] DOB/AGE: 1976/04/08 42 y.o.  Admit date: 11/06/2017 Discharge date: 11/07/2017  Admitting Diagnosis: Acute appendicitis  Discharge Diagnosis Patient Active Problem List   Diagnosis Date Noted  . Appendicitis 11/06/2017    Consultants None  Imaging: Ct Abdomen Pelvis W Contrast  Result Date: 11/06/2017 CLINICAL DATA:  RIGHT lower quadrant pain since midnight. EXAM: CT ABDOMEN AND PELVIS WITH CONTRAST TECHNIQUE: Multidetector CT imaging of the abdomen and pelvis was performed using the standard protocol following bolus administration of intravenous contrast. CONTRAST:  15mL ISOVUE-300 IOPAMIDOL (ISOVUE-300) INJECTION 61%, ISOVUE-300 IOPAMIDOL (ISOVUE-300) INJECTION 61% COMPARISON:  None. FINDINGS: Lower chest: No acute abnormality. Hepatobiliary: No focal liver abnormality is seen. No gallstones, gallbladder wall thickening, or biliary dilatation. Pancreas: Unremarkable. No pancreatic ductal dilatation or surrounding inflammatory changes. Spleen: Normal in size without focal abnormality. Adrenals/Urinary Tract: Adrenal glands are unremarkable. Kidneys are normal, without renal calculi, focal lesion, or hydronephrosis. Bladder is unremarkable. Simple cysts in the RIGHT greater than LEFT kidney, largest of which is RIGHT parapelvic, greater than 3 cm. Stomach/Bowel: Stomach, and small bowel are normal. There is acute appendicitis, dilated appendix with appendicolith up to 14 mm in diameter with surrounding inflammation, up to 14 mm in diameter, with surrounding inflammatory change, query localized perforation. The cecum is intrahepatic, and the inflammatory change extends along the RIGHT pericolic gutter. Vascular/Lymphatic: No significant vascular findings are present. No enlarged abdominal or pelvic lymph nodes. Reproductive: Prostate is unremarkable. Other: Small fat containing umbilical hernia.  Musculoskeletal: Lumbar spondylosis. IMPRESSION: Acute appendicitis, with possible localized perforation. These results were called by telephone at the time of interpretation on 11/06/2017 at 4:04 pm to [facility] , who verbally acknowledged these results. Electronically Signed   By: Elsie Stain M.D.   On: 11/06/2017 16:06    Procedures Dr. Magnus Ivan (11/06/17) - Laparoscopic Appendectomy  Hospital Course:  Patient is a 42 year old male who presented to [facility] with abdominal pain.  Workup showed acute appendicitis.  Patient was admitted and underwent procedure listed above.  Tolerated procedure well and was transferred to the floor.  Diet was advanced as tolerated.  On POD#1, the patient was voiding well, tolerating diet, ambulating well, pain well controlled, vital signs stable, incisions c/d/i and felt stable for discharge home.  Patient will follow up in our office in 2 weeks and knows to call with questions or concerns.  He will call to confirm appointment date/time.    Physical Exam: General:  Alert, NAD, pleasant, comfortable Abd:  Soft, ND, mild tenderness, incisions C/D/I  Allergies as of 11/07/2017   No Known Allergies     Medication List    TAKE these medications   calcium carbonate 500 MG chewable tablet Commonly known as:  TUMS - dosed in mg elemental calcium Chew 1-2 tablets by mouth 2 (two) times daily as needed for indigestion or heartburn.   oxyCODONE 5 MG immediate release tablet Commonly known as:  Oxy IR/ROXICODONE Take 1 tablet (5 mg total) by mouth every 6 (six) hours as needed for moderate pain.   simethicone 80 MG chewable tablet Commonly known as:  MYLICON Chew 80 mg by mouth every 6 (six) hours as needed for flatulence.        Follow-up Information    [facility] Go on 11/27/2017.   Specialty:  General Surgery Why:  Your follow up appointment is scheduled for 1:30 PM. Please arrive by 1:00 PM for check  in. Bring photo ID and insurance  information.  Contact information: [address] [place] [address] [phone]           Signed: Wells GuilesKelly Rayburn, [facility] 11/07/2017, 12:34 PM Pager: [phone] Consults: [phone] Mon-Fri 7:00 am-4:30 pm Sat-Sun 7:00 am-11:30 am

## 2017-11-07 NOTE — Anesthesia Postprocedure Evaluation (Signed)
Anesthesia Post Note  Patient: Jeremy Lewis  Procedure(s) Performed: APPENDECTOMY LAPAROSCOPIC (N/A )     Patient location during evaluation: PACU Anesthesia Type: General Level of consciousness: awake and alert Pain management: pain level controlled Vital Signs Assessment: post-procedure vital signs reviewed and stable Respiratory status: spontaneous breathing, nonlabored ventilation, respiratory function stable and patient connected to nasal cannula oxygen Cardiovascular status: blood pressure returned to baseline and stable Postop Assessment: no apparent nausea or vomiting Anesthetic complications: no    Last Vitals:  Vitals:   11/06/17 2235 11/07/17 0539  BP: (!) 157/107 138/89  Pulse: 96 91  Resp: 17 18  Temp: 36.8 C 36.9 C  SpO2: 95% 97%    Last Pain:  Vitals:   11/07/17 0232  TempSrc:   PainSc: 2                  Jenness Stemler

## 2017-11-07 NOTE — Discharge Instructions (Signed)
Please arrive at least 30 min before your appointment to complete your check in paperwork.  If you are unable to arrive 30 min prior to your appointment time we may have to cancel or reschedule you. ° °LAPAROSCOPIC SURGERY: POST OP INSTRUCTIONS  °1. DIET: Follow a light bland diet the first 24 hours after arrival home, such as soup, liquids, crackers, etc. Be sure to include lots of fluids daily. Avoid fast food or heavy meals as your are more likely to get nauseated. Eat a low fat the next few days after surgery.  °2. Take your usually prescribed home medications unless otherwise directed. °3. PAIN CONTROL:  °1. Pain is best controlled by a usual combination of three different methods TOGETHER:  °1. Ice/Heat °2. Over the counter pain medication °3. Prescription pain medication °2. Most patients will experience some swelling and bruising around the incisions. Ice packs or heating pads (30-60 minutes up to 6 times a day) will help. Use ice for the first few days to help decrease swelling and bruising, then switch to heat to help relax tight/sore spots and speed recovery. Some people prefer to use ice alone, heat alone, alternating between ice & heat. Experiment to what works for you. Swelling and bruising can take several weeks to resolve.  °3. It is helpful to take an over-the-counter pain medication regularly for the first few weeks. Choose one of the following that works best for you:  °1. Naproxen (Aleve, etc) Two 220mg tabs twice a day °2. Ibuprofen (Advil, etc) Three 200mg tabs four times a day (every meal & bedtime) °3. Acetaminophen (Tylenol, etc) 500-650mg four times a day (every meal & bedtime) °4. A prescription for pain medication (such as oxycodone, hydrocodone, etc) should be given to you upon discharge. Take your pain medication as prescribed.  °1. If you are having problems/concerns with the prescription medicine (does not control pain, nausea, vomiting, rash, itching, etc), please call us [phone] to see if we need to switch you to a different pain medicine that will work better for you and/or control your side effect better. °2. If you need a refill on your pain medication, please contact your pharmacy. They will contact our office to request authorization. Prescriptions will not be filled after 5 pm or on week-ends. °4. Avoid getting constipated. Between the surgery and the pain medications, it is common to experience some constipation. Increasing fluid intake and taking a fiber supplement (such as Metamucil, Citrucel, FiberCon, MiraLax, etc) 1-2 times a day regularly will usually help prevent this problem from occurring. A mild laxative (prune juice, Milk of Magnesia, MiraLax, etc) should be taken according to package directions if there are no bowel movements after 48 hours.  °5. Watch out for diarrhea. If you have many loose bowel movements, simplify your diet to bland foods & liquids for a few days. Stop any stool softeners and decrease your fiber supplement. Switching to mild anti-diarrheal medications (Kayopectate, Pepto Bismol) can help. If this worsens or does not improve, please call us. °6. Wash / shower every day. You may shower over the dressings as they are waterproof. Continue to shower over incision(s) after the dressing is off. °7. Remove your waterproof bandages 5 days after surgery. You may leave the incision open to air. You may replace a dressing/Band-Aid to cover the incision for comfort if you wish.  °8. ACTIVITIES as tolerated:  °1. You may resume regular (light) daily activities beginning the next day--such as daily self-care, walking, climbing stairs--gradually   increasing activities as tolerated. If you can walk 30 minutes without difficulty, it is safe to try more intense activity such as jogging, treadmill, bicycling, low-impact aerobics, swimming, etc. °2. Save the most intensive and strenuous activity for last such as sit-ups, heavy lifting, contact sports, etc Refrain  from any heavy lifting or straining until you are off narcotics for pain control.  °3. DO NOT PUSH THROUGH PAIN. Let pain be your guide: If it hurts to do something, don't do it. Pain is your body warning you to avoid that activity for another week until the pain goes down. °4. You may drive when you are no longer taking prescription pain medication, you can comfortably wear a seatbelt, and you can safely maneuver your car and apply brakes. °5. You may have sexual intercourse when it is comfortable.  °9. FOLLOW UP in our office  °1. Please call [facility] at [phone] to set up an appointment to see your surgeon in the office for a follow-up appointment approximately 2-3 weeks after your surgery. °2. Make sure that you call for this appointment the day you arrive home to insure a convenient appointment time. °     10. IF YOU HAVE DISABILITY OR FAMILY LEAVE FORMS, BRING THEM TO THE               OFFICE FOR PROCESSING.  ° °WHEN TO CALL US [phone]:  °1. Poor pain control °2. Reactions / problems with new medications (rash/itching, nausea, etc)  °3. Fever over 101.5 F (38.5 C) °4. Inability to urinate °5. Nausea and/or vomiting °6. Worsening swelling or bruising °7. Continued bleeding from incision. °8. Increased pain, redness, or drainage from the incision ° °The clinic staff is available to answer your questions during regular business hours (8:30am-5pm). Please don’t hesitate to call and ask to speak to one of our nurses for clinical concerns.  °If you have a medical emergency, go to the nearest emergency room or call 911.  °A surgeon from [facility] is always on call at the hospitals  ° °[facility]  °[address], [address], [place], [place] [address] ?  °MAIN: [phone] ? TOLL FREE: [phone] ?  °FAX [phone]  °[email or web address] ° °

## 2019-07-10 IMAGING — CT CT ABD-PELV W/ CM
2 of 5 series · 16 of 46 positions shown, 18 images · IV contrast (APPLIED)
Comparison: None.

CLINICAL DATA: RIGHT lower quadrant pain since midnight.

EXAM:
CT ABDOMEN AND PELVIS WITH CONTRAST
TECHNIQUE: Multidetector CT imaging of the abdomen and pelvis was performed
using the standard protocol following bolus administration of
intravenous contrast.
CONTRAST:  15mL BH696B-ECC IOPAMIDOL (BH696B-ECC) INJECTION 61%,
100mL BH696B-ECC IOPAMIDOL (BH696B-ECC) INJECTION 61%

[Series 2: axial st · axial · 0.98mm/px · z∈[-516,-26]mm · 13 of 110 slices shown, 15 images]
[im 6/110  soft-tissue]
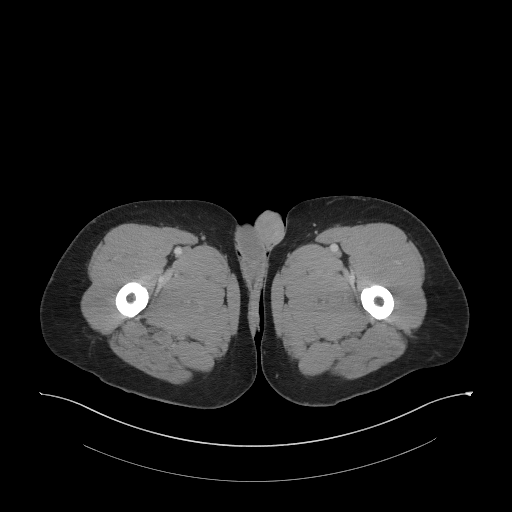
[im 6/110  bone]
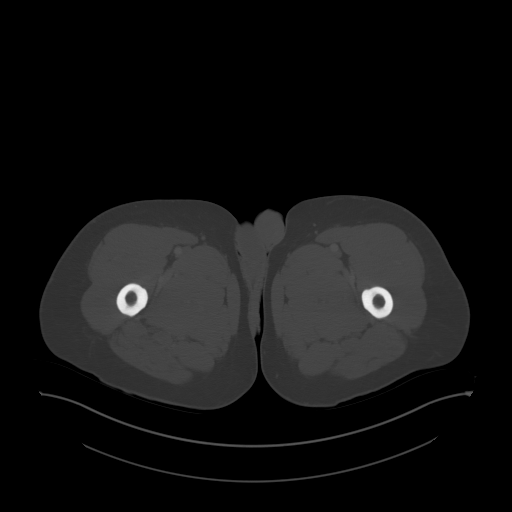
[im 17/110  soft-tissue]
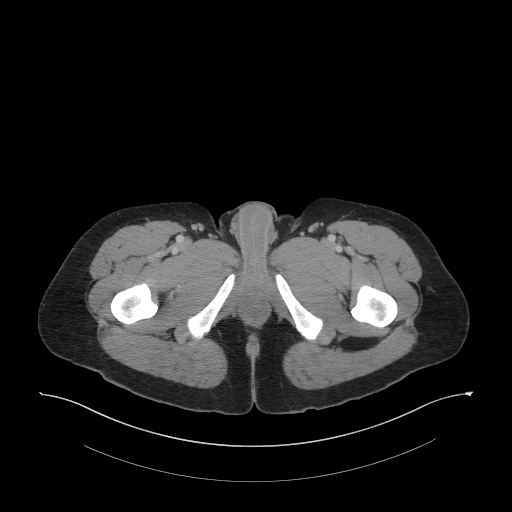
[im 22/110  soft-tissue]
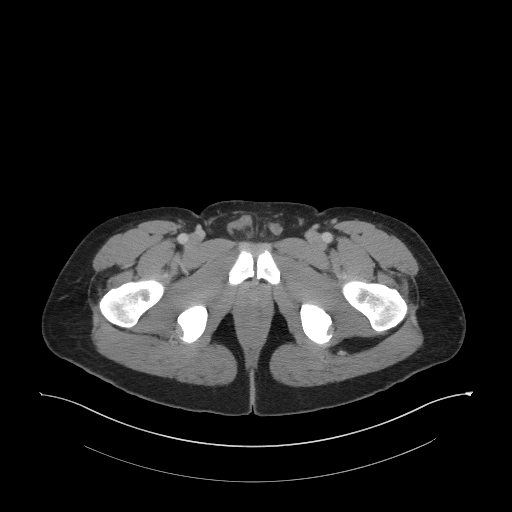
[im 33/110  soft-tissue]
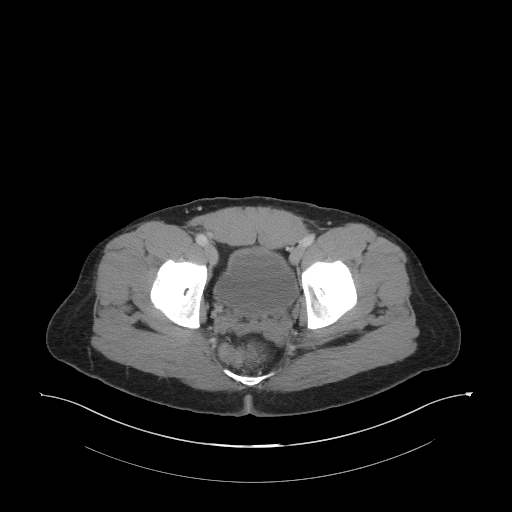
[im 39/110  soft-tissue]
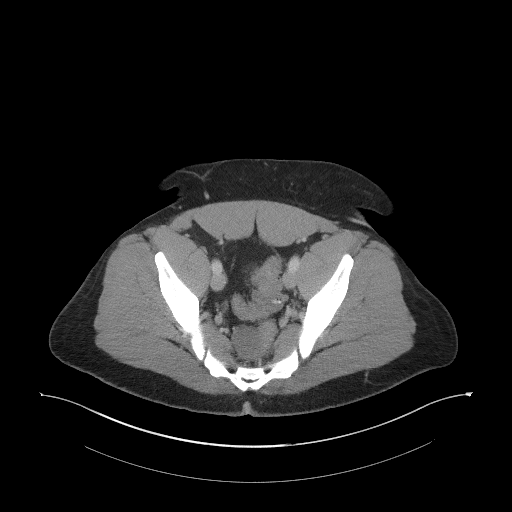
[im 50/110  soft-tissue]
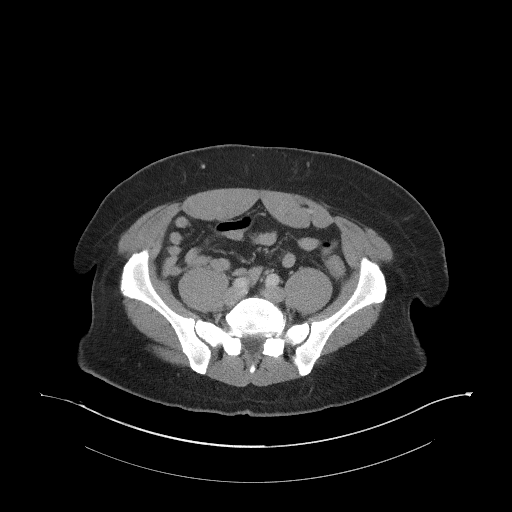
[im 55/110  soft-tissue]
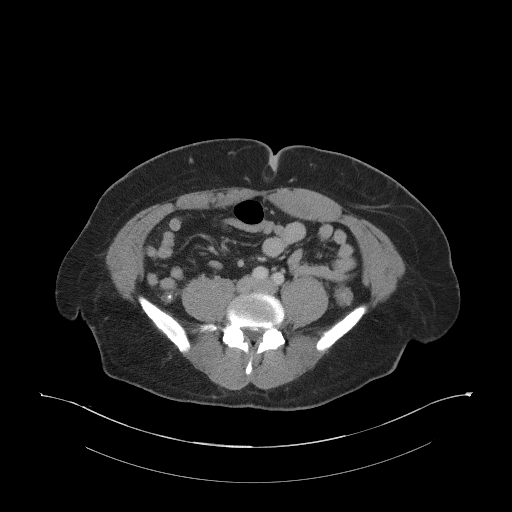
[im 60/110  soft-tissue]
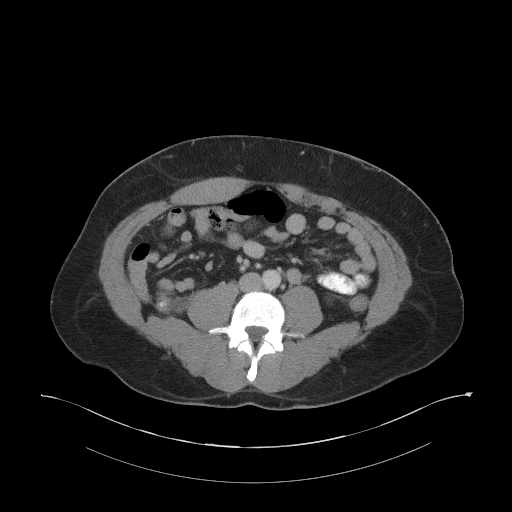
[im 71/110  soft-tissue]
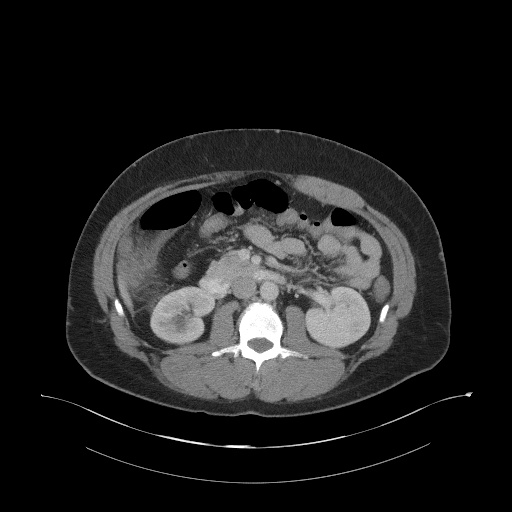
[im 71/110  bone]
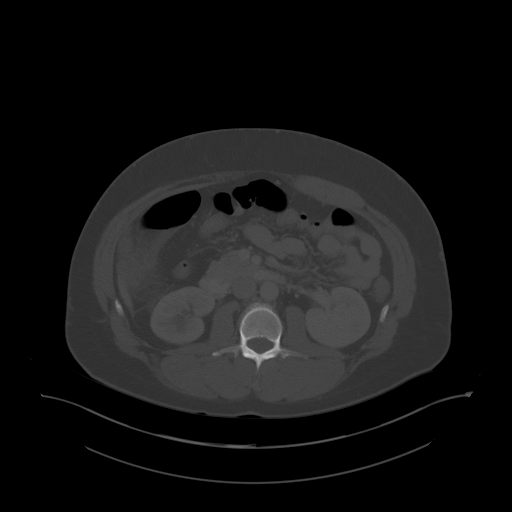
[im 77/110  soft-tissue]
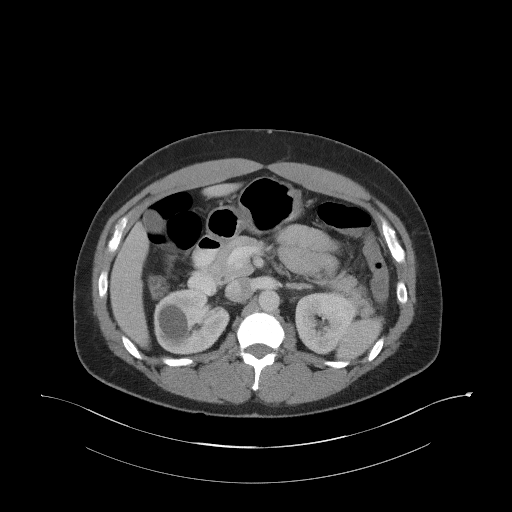
[im 88/110  soft-tissue]
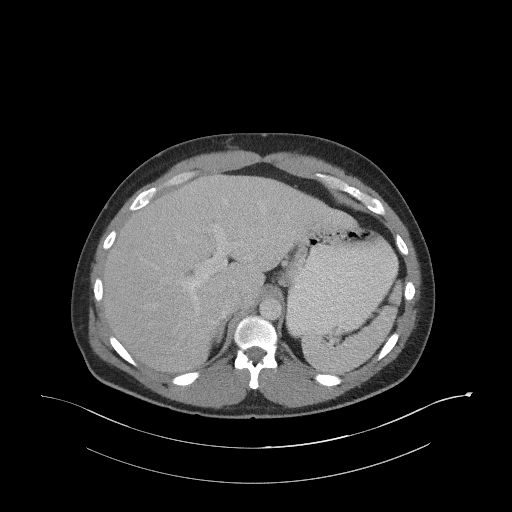
[im 93/110  soft-tissue]
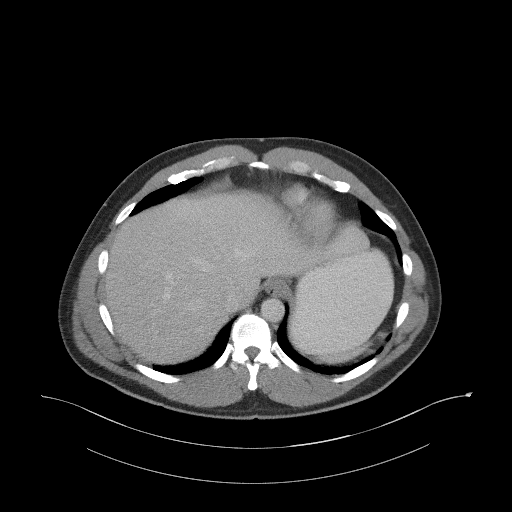
[im 104/110  soft-tissue]
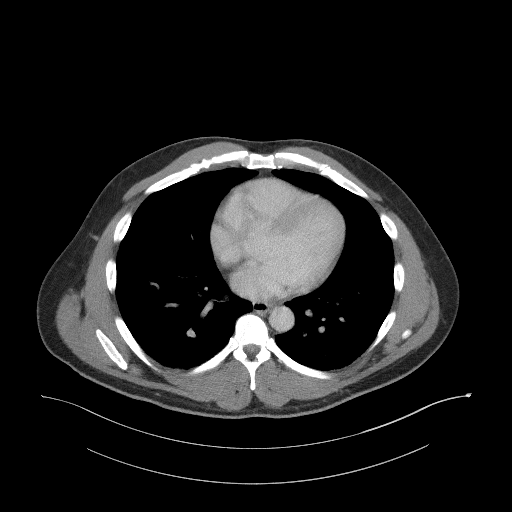

[Series 5: coronal st · coronal · 0.81mm/px · 3 of 97 slices shown]
[im 33/97  soft-tissue]
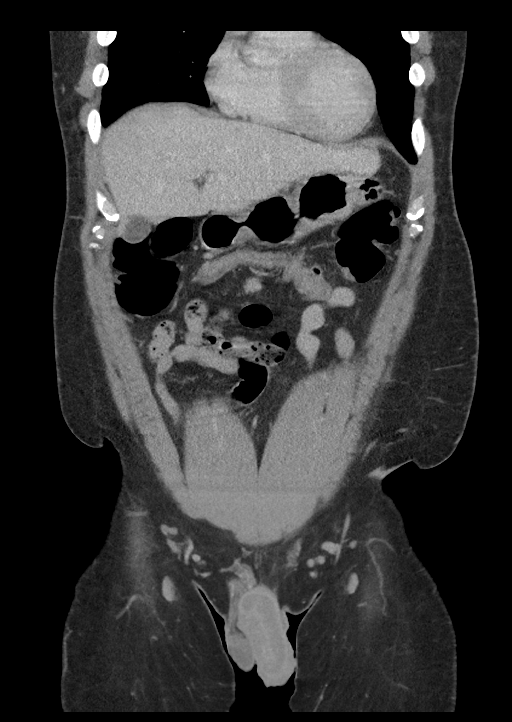
[im 43/97  soft-tissue]
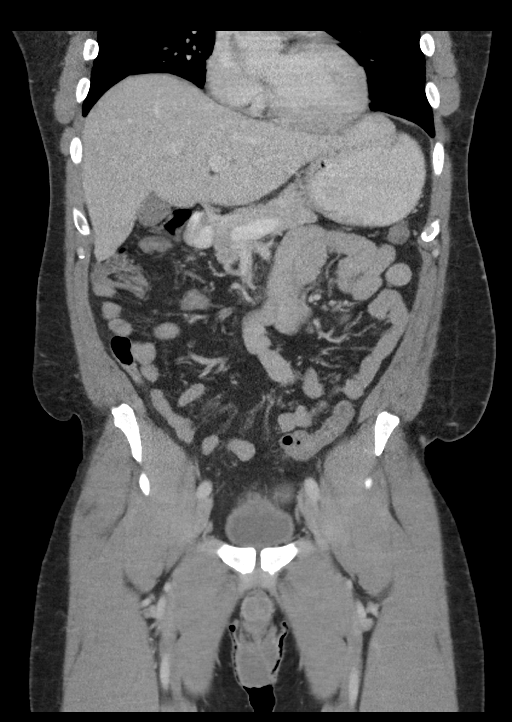
[im 54/97  soft-tissue]
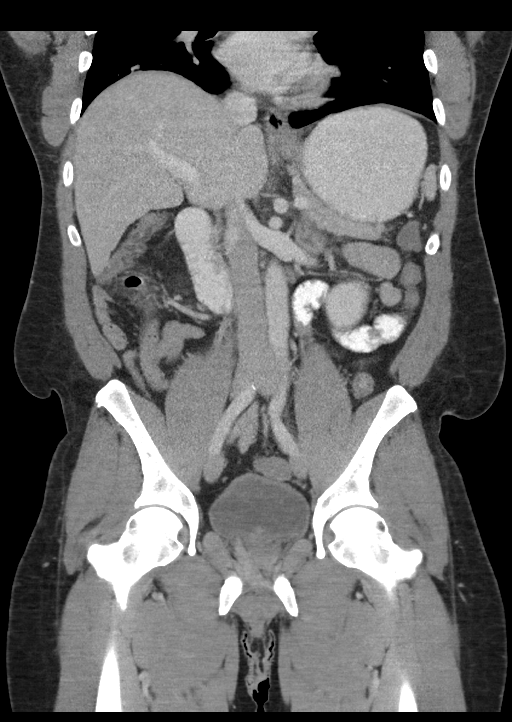

[16 of 46 positions shown; findings below may reference images not displayed]

FINDINGS: Lower chest: No acute abnormality.

Hepatobiliary: No focal liver abnormality is seen. No gallstones,
gallbladder wall thickening, or biliary dilatation.

Pancreas: Unremarkable. No pancreatic ductal dilatation or
surrounding inflammatory changes.

Spleen: Normal in size without focal abnormality.

Adrenals/Urinary Tract: Adrenal glands are unremarkable. Kidneys are
normal, without renal calculi, focal lesion, or hydronephrosis.
Bladder is unremarkable. Simple cysts in the RIGHT greater than LEFT
kidney, largest of which is RIGHT parapelvic, greater than 3 cm.

Stomach/Bowel: Stomach, and small bowel are normal. There is acute
appendicitis, dilated appendix with appendicolith up to 14 mm in
diameter with surrounding inflammation, up to 14 mm in diameter,
with surrounding inflammatory change, query localized perforation.
The cecum is intrahepatic, and the inflammatory change extends along
the RIGHT pericolic gutter.

Vascular/Lymphatic: No significant vascular findings are present. No
enlarged abdominal or pelvic lymph nodes.

Reproductive: Prostate is unremarkable.

Other: Small fat containing umbilical hernia.

Musculoskeletal: Lumbar spondylosis.
IMPRESSION: Acute appendicitis, with possible localized perforation.

These results were called by telephone at the time of interpretation
on 11/06/2017 at [DATE] to Chai PA , who verbally acknowledged
these results.
